# Patient Record
Sex: Male | Born: 1980 | Race: White | Hispanic: No | Marital: Married | Smoking: Current every day smoker
Health system: Southern US, Community
[De-identification: ages and names within clinical notes are randomized; demographics above are authoritative.]

## PROBLEM LIST (undated history)

## (undated) DIAGNOSIS — F1120 Opioid dependence, uncomplicated: Secondary | ICD-10-CM

## (undated) DIAGNOSIS — G5600 Carpal tunnel syndrome, unspecified upper limb: Secondary | ICD-10-CM

## (undated) HISTORY — DX: Carpal tunnel syndrome, unspecified upper limb: G56.00

## (undated) HISTORY — DX: Opioid dependence, uncomplicated: F11.20

## (undated) HISTORY — PX: FOOT SURGERY: SHX648

---
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
## 2002-05-08 ENCOUNTER — Emergency Department (HOSPITAL_COMMUNITY): Admission: EM | Admit: 2002-05-08 | Discharge: 2002-05-08 | Payer: Self-pay | Admitting: Emergency Medicine

## 2002-05-08 ENCOUNTER — Encounter: Payer: Self-pay | Admitting: Emergency Medicine

## 2002-06-10 ENCOUNTER — Encounter: Payer: Self-pay | Admitting: Emergency Medicine

## 2002-06-10 ENCOUNTER — Emergency Department (HOSPITAL_COMMUNITY): Admission: EM | Admit: 2002-06-10 | Discharge: 2002-06-10 | Payer: Self-pay | Admitting: Emergency Medicine

## 2002-06-21 ENCOUNTER — Encounter: Payer: Self-pay | Admitting: Emergency Medicine

## 2002-06-21 ENCOUNTER — Emergency Department (HOSPITAL_COMMUNITY): Admission: EM | Admit: 2002-06-21 | Discharge: 2002-06-21 | Payer: Self-pay | Admitting: Emergency Medicine

## 2002-06-28 ENCOUNTER — Emergency Department (HOSPITAL_COMMUNITY): Admission: EM | Admit: 2002-06-28 | Discharge: 2002-06-28 | Payer: Self-pay | Admitting: Emergency Medicine

## 2002-08-29 ENCOUNTER — Emergency Department (HOSPITAL_COMMUNITY): Admission: EM | Admit: 2002-08-29 | Discharge: 2002-08-29 | Payer: Self-pay

## 2002-09-10 ENCOUNTER — Emergency Department (HOSPITAL_COMMUNITY): Admission: EM | Admit: 2002-09-10 | Discharge: 2002-09-10 | Payer: Self-pay | Admitting: Emergency Medicine

## 2002-09-10 ENCOUNTER — Encounter: Payer: Self-pay | Admitting: Emergency Medicine

## 2003-05-06 ENCOUNTER — Emergency Department (HOSPITAL_COMMUNITY): Admission: EM | Admit: 2003-05-06 | Discharge: 2003-05-06 | Payer: Self-pay | Admitting: Emergency Medicine

## 2003-05-15 ENCOUNTER — Emergency Department (HOSPITAL_COMMUNITY): Admission: EM | Admit: 2003-05-15 | Discharge: 2003-05-15 | Payer: Self-pay | Admitting: Emergency Medicine

## 2003-05-30 ENCOUNTER — Emergency Department (HOSPITAL_COMMUNITY): Admission: EM | Admit: 2003-05-30 | Discharge: 2003-05-30 | Payer: Self-pay | Admitting: Emergency Medicine

## 2003-06-18 ENCOUNTER — Emergency Department (HOSPITAL_COMMUNITY): Admission: EM | Admit: 2003-06-18 | Discharge: 2003-06-18 | Payer: Self-pay | Admitting: Emergency Medicine

## 2003-06-18 ENCOUNTER — Encounter: Payer: Self-pay | Admitting: Emergency Medicine

## 2003-07-26 ENCOUNTER — Emergency Department (HOSPITAL_COMMUNITY): Admission: AD | Admit: 2003-07-26 | Discharge: 2003-07-26 | Payer: Self-pay | Admitting: Family Medicine

## 2003-08-01 ENCOUNTER — Emergency Department (HOSPITAL_COMMUNITY): Admission: EM | Admit: 2003-08-01 | Discharge: 2003-08-02 | Payer: Self-pay | Admitting: *Deleted

## 2003-12-07 ENCOUNTER — Emergency Department (HOSPITAL_COMMUNITY): Admission: EM | Admit: 2003-12-07 | Discharge: 2003-12-07 | Payer: Self-pay | Admitting: Emergency Medicine

## 2003-12-11 ENCOUNTER — Emergency Department (HOSPITAL_COMMUNITY): Admission: AD | Admit: 2003-12-11 | Discharge: 2003-12-12 | Payer: Self-pay | Admitting: Emergency Medicine

## 2004-02-06 ENCOUNTER — Emergency Department (HOSPITAL_COMMUNITY): Admission: EM | Admit: 2004-02-06 | Discharge: 2004-02-06 | Payer: Self-pay | Admitting: Emergency Medicine

## 2004-05-09 ENCOUNTER — Emergency Department (HOSPITAL_COMMUNITY): Admission: EM | Admit: 2004-05-09 | Discharge: 2004-05-09 | Payer: Self-pay | Admitting: Emergency Medicine

## 2004-05-11 ENCOUNTER — Emergency Department (HOSPITAL_COMMUNITY): Admission: EM | Admit: 2004-05-11 | Discharge: 2004-05-11 | Payer: Self-pay | Admitting: Family Medicine

## 2004-07-09 ENCOUNTER — Emergency Department (HOSPITAL_COMMUNITY): Admission: EM | Admit: 2004-07-09 | Discharge: 2004-07-09 | Payer: Self-pay | Admitting: Emergency Medicine

## 2004-07-14 ENCOUNTER — Emergency Department (HOSPITAL_COMMUNITY): Admission: EM | Admit: 2004-07-14 | Discharge: 2004-07-14 | Payer: Self-pay | Admitting: Emergency Medicine

## 2004-07-18 ENCOUNTER — Emergency Department (HOSPITAL_COMMUNITY): Admission: EM | Admit: 2004-07-18 | Discharge: 2004-07-18 | Payer: Self-pay | Admitting: Emergency Medicine

## 2004-08-06 ENCOUNTER — Emergency Department (HOSPITAL_COMMUNITY): Admission: EM | Admit: 2004-08-06 | Discharge: 2004-08-07 | Payer: Self-pay | Admitting: Emergency Medicine

## 2004-09-08 ENCOUNTER — Encounter
Admission: RE | Admit: 2004-09-08 | Discharge: 2004-12-07 | Payer: Self-pay | Admitting: Physical Medicine & Rehabilitation

## 2004-09-10 ENCOUNTER — Ambulatory Visit: Payer: Self-pay | Admitting: Physical Medicine & Rehabilitation

## 2004-10-02 ENCOUNTER — Ambulatory Visit: Payer: Self-pay | Admitting: Psychology

## 2004-11-02 ENCOUNTER — Emergency Department (HOSPITAL_COMMUNITY): Admission: EM | Admit: 2004-11-02 | Discharge: 2004-11-02 | Payer: Self-pay | Admitting: Emergency Medicine

## 2005-06-18 ENCOUNTER — Encounter: Payer: Self-pay | Admitting: Anesthesiology

## 2005-06-27 ENCOUNTER — Encounter: Payer: Self-pay | Admitting: Anesthesiology

## 2006-01-11 ENCOUNTER — Emergency Department (HOSPITAL_COMMUNITY): Admission: EM | Admit: 2006-01-11 | Discharge: 2006-01-11 | Payer: Self-pay | Admitting: Emergency Medicine

## 2006-01-23 ENCOUNTER — Emergency Department (HOSPITAL_COMMUNITY): Admission: EM | Admit: 2006-01-23 | Discharge: 2006-01-23 | Payer: Self-pay | Admitting: Emergency Medicine

## 2006-02-16 ENCOUNTER — Emergency Department (HOSPITAL_COMMUNITY): Admission: EM | Admit: 2006-02-16 | Discharge: 2006-02-16 | Payer: Self-pay | Admitting: Emergency Medicine

## 2006-04-25 ENCOUNTER — Emergency Department (HOSPITAL_COMMUNITY): Admission: EM | Admit: 2006-04-25 | Discharge: 2006-04-26 | Payer: Self-pay | Admitting: Emergency Medicine

## 2006-05-16 ENCOUNTER — Emergency Department (HOSPITAL_COMMUNITY): Admission: EM | Admit: 2006-05-16 | Discharge: 2006-05-16 | Payer: Self-pay | Admitting: Emergency Medicine

## 2006-06-10 ENCOUNTER — Emergency Department (HOSPITAL_COMMUNITY): Admission: EM | Admit: 2006-06-10 | Discharge: 2006-06-10 | Payer: Self-pay | Admitting: *Deleted

## 2006-06-26 ENCOUNTER — Emergency Department (HOSPITAL_COMMUNITY): Admission: EM | Admit: 2006-06-26 | Discharge: 2006-06-26 | Payer: Self-pay | Admitting: Emergency Medicine

## 2006-07-03 ENCOUNTER — Emergency Department (HOSPITAL_COMMUNITY): Admission: EM | Admit: 2006-07-03 | Discharge: 2006-07-03 | Payer: Self-pay | Admitting: Emergency Medicine

## 2006-07-18 ENCOUNTER — Emergency Department (HOSPITAL_COMMUNITY): Admission: EM | Admit: 2006-07-18 | Discharge: 2006-07-18 | Payer: Self-pay | Admitting: Emergency Medicine

## 2006-07-19 ENCOUNTER — Emergency Department (HOSPITAL_COMMUNITY): Admission: EM | Admit: 2006-07-19 | Discharge: 2006-07-19 | Payer: Self-pay | Admitting: Emergency Medicine

## 2006-07-24 ENCOUNTER — Emergency Department (HOSPITAL_COMMUNITY): Admission: EM | Admit: 2006-07-24 | Discharge: 2006-07-25 | Payer: Self-pay | Admitting: Emergency Medicine

## 2006-08-04 ENCOUNTER — Encounter: Admission: RE | Admit: 2006-08-04 | Discharge: 2006-08-04 | Payer: Self-pay | Admitting: General Practice
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
## 2007-10-06 ENCOUNTER — Emergency Department (HOSPITAL_COMMUNITY): Admission: EM | Admit: 2007-10-06 | Discharge: 2007-10-06 | Payer: Self-pay | Admitting: Family Medicine

## 2007-11-14 ENCOUNTER — Emergency Department (HOSPITAL_COMMUNITY): Admission: EM | Admit: 2007-11-14 | Discharge: 2007-11-14 | Payer: Self-pay | Admitting: Emergency Medicine

## 2008-06-15 ENCOUNTER — Emergency Department (HOSPITAL_COMMUNITY): Admission: EM | Admit: 2008-06-15 | Discharge: 2008-06-15 | Payer: Self-pay | Admitting: Emergency Medicine

## 2008-06-29 ENCOUNTER — Emergency Department (HOSPITAL_COMMUNITY): Admission: EM | Admit: 2008-06-29 | Discharge: 2008-06-29 | Payer: Self-pay | Admitting: Emergency Medicine

## 2008-06-30 ENCOUNTER — Emergency Department (HOSPITAL_COMMUNITY): Admission: EM | Admit: 2008-06-30 | Discharge: 2008-06-30 | Payer: Self-pay | Admitting: *Deleted

## 2008-07-07 ENCOUNTER — Emergency Department (HOSPITAL_COMMUNITY): Admission: EM | Admit: 2008-07-07 | Discharge: 2008-07-07 | Payer: Self-pay | Admitting: Emergency Medicine

## 2008-08-28 ENCOUNTER — Emergency Department (HOSPITAL_COMMUNITY): Admission: EM | Admit: 2008-08-28 | Discharge: 2008-08-29 | Payer: Self-pay | Admitting: Emergency Medicine

## 2008-09-02 ENCOUNTER — Emergency Department (HOSPITAL_COMMUNITY): Admission: EM | Admit: 2008-09-02 | Discharge: 2008-09-02 | Payer: Self-pay | Admitting: Emergency Medicine

## 2008-09-10 ENCOUNTER — Emergency Department (HOSPITAL_COMMUNITY): Admission: EM | Admit: 2008-09-10 | Discharge: 2008-09-10 | Payer: Self-pay | Admitting: Emergency Medicine

## 2008-09-25 ENCOUNTER — Emergency Department (HOSPITAL_COMMUNITY): Admission: EM | Admit: 2008-09-25 | Discharge: 2008-09-25 | Payer: Self-pay | Admitting: Emergency Medicine

## 2008-10-03 ENCOUNTER — Emergency Department (HOSPITAL_COMMUNITY): Admission: EM | Admit: 2008-10-03 | Discharge: 2008-10-03 | Payer: Self-pay | Admitting: Emergency Medicine

## 2009-02-27 ENCOUNTER — Emergency Department (HOSPITAL_COMMUNITY): Admission: EM | Admit: 2009-02-27 | Discharge: 2009-02-27 | Payer: Self-pay | Admitting: Emergency Medicine
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
## 2009-07-26 ENCOUNTER — Emergency Department (HOSPITAL_COMMUNITY): Admission: EM | Admit: 2009-07-26 | Discharge: 2009-07-26 | Payer: Self-pay | Admitting: Emergency Medicine

## 2009-08-25 ENCOUNTER — Emergency Department (HOSPITAL_COMMUNITY): Admission: EM | Admit: 2009-08-25 | Discharge: 2009-08-25 | Payer: Self-pay | Admitting: Emergency Medicine

## 2009-09-23 ENCOUNTER — Emergency Department (HOSPITAL_COMMUNITY): Admission: EM | Admit: 2009-09-23 | Discharge: 2009-09-24 | Payer: Self-pay | Admitting: Emergency Medicine

## 2009-09-28 ENCOUNTER — Emergency Department (HOSPITAL_COMMUNITY): Admission: EM | Admit: 2009-09-28 | Discharge: 2009-09-28 | Payer: Self-pay | Admitting: Emergency Medicine

## 2009-11-25 ENCOUNTER — Encounter: Admission: RE | Admit: 2009-11-25 | Discharge: 2009-11-25 | Payer: Self-pay | Admitting: Nephrology

## 2010-01-01 ENCOUNTER — Emergency Department: Payer: Self-pay | Admitting: Emergency Medicine

## 2010-02-12 ENCOUNTER — Emergency Department: Payer: Self-pay | Admitting: Emergency Medicine

## 2010-03-18 ENCOUNTER — Inpatient Hospital Stay (HOSPITAL_COMMUNITY): Admission: EM | Admit: 2010-03-18 | Discharge: 2010-03-19 | Payer: Self-pay | Admitting: Emergency Medicine

## 2010-03-18 ENCOUNTER — Ambulatory Visit: Payer: Self-pay | Admitting: Internal Medicine

## 2010-06-02 ENCOUNTER — Emergency Department: Payer: Self-pay | Admitting: Emergency Medicine

## 2010-06-09 ENCOUNTER — Emergency Department: Payer: Self-pay | Admitting: Emergency Medicine

## 2010-08-17 ENCOUNTER — Emergency Department: Payer: Self-pay | Admitting: Emergency Medicine

## 2010-08-29 ENCOUNTER — Emergency Department: Payer: Self-pay | Admitting: Emergency Medicine

## 2010-09-26 ENCOUNTER — Emergency Department: Payer: Self-pay | Admitting: Emergency Medicine

## 2010-10-17 ENCOUNTER — Encounter: Payer: Self-pay | Admitting: Physical Medicine & Rehabilitation

## 2010-10-18 ENCOUNTER — Encounter: Payer: Self-pay | Admitting: Sports Medicine

## 2010-10-18 ENCOUNTER — Encounter: Payer: Self-pay | Admitting: Nephrology

## 2010-12-13 LAB — CARDIAC PANEL(CRET KIN+CKTOT+MB+TROPI)
Relative Index: INVALID (ref 0.0–2.5)
Relative Index: INVALID (ref 0.0–2.5)
Total CK: 59 U/L (ref 7–232)
Total CK: 71 U/L (ref 7–232)

## 2010-12-13 LAB — URINALYSIS, ROUTINE W REFLEX MICROSCOPIC
Glucose, UA: NEGATIVE mg/dL
Hgb urine dipstick: NEGATIVE
Specific Gravity, Urine: 1.013 (ref 1.005–1.030)
Urobilinogen, UA: 0.2 mg/dL (ref 0.0–1.0)
pH: 6.5 (ref 5.0–8.0)

## 2010-12-13 LAB — BASIC METABOLIC PANEL
CO2: 24 mEq/L (ref 19–32)
CO2: 27 mEq/L (ref 19–32)
Calcium: 8.2 mg/dL — ABNORMAL LOW (ref 8.4–10.5)
Calcium: 8.4 mg/dL (ref 8.4–10.5)
Calcium: 8.8 mg/dL (ref 8.4–10.5)
Chloride: 110 mEq/L (ref 96–112)
Creatinine, Ser: 0.93 mg/dL (ref 0.4–1.5)
GFR calc Af Amer: >60 mL/min (ref >60)
GFR calc Af Amer: >60 mL/min (ref >60)
GFR calc Af Amer: >60 mL/min (ref >60)
GFR calc non Af Amer: >60 mL/min (ref >60)
GFR calc non Af Amer: >60 mL/min (ref >60)
Glucose, Bld: 96 mg/dL (ref 70–99)
Potassium: 3.4 mEq/L — ABNORMAL LOW (ref 3.5–5.1)
Sodium: 140 mEq/L (ref 135–145)
Sodium: 142 mEq/L (ref 135–145)

## 2010-12-13 LAB — CBC
Hemoglobin: 12.6 g/dL — ABNORMAL LOW (ref 13.0–17.0)
Hemoglobin: 15.6 g/dL (ref 13.0–17.0)
MCH: 30.8 pg (ref 26.0–34.0)
MCV: 89.6 fL (ref 78.0–100.0)
Platelets: 139 10*3/uL — ABNORMAL LOW (ref 150–400)
Platelets: 170 10*3/uL (ref 150–400)
RBC: 4.08 MIL/uL — ABNORMAL LOW (ref 4.22–5.81)
RDW: 13.4 % (ref 11.5–15.5)
WBC: 6.7 10*3/uL (ref 4.0–10.5)
WBC: 9 10*3/uL (ref 4.0–10.5)

## 2010-12-13 LAB — RAPID URINE DRUG SCREEN, HOSP PERFORMED
Amphetamines: NOT DETECTED
Barbiturates: NOT DETECTED
Benzodiazepines: POSITIVE — AB
Opiates: NOT DETECTED

## 2010-12-13 LAB — ACETAMINOPHEN LEVEL: Acetaminophen (Tylenol), Serum: <10 ug/mL — ABNORMAL LOW (ref 10–30)

## 2010-12-13 LAB — URINE CULTURE: Culture: NO GROWTH

## 2010-12-13 LAB — POCT I-STAT, CHEM 8
Calcium, Ion: 1.22 mmol/L (ref 1.12–1.32)
Creatinine, Ser: 1 mg/dL (ref 0.4–1.5)
Glucose, Bld: 130 mg/dL — ABNORMAL HIGH (ref 70–99)
Hemoglobin: 16 g/dL (ref 13.0–17.0)
TCO2: 28 mmol/L (ref 0–100)

## 2010-12-13 LAB — MRSA PCR SCREENING: MRSA by PCR: NEGATIVE

## 2010-12-13 LAB — LEGIONELLA ANTIGEN, URINE: Legionella Antigen, Urine: NEGATIVE

## 2010-12-13 LAB — POCT I-STAT 3, ART BLOOD GAS (G3+)
Bicarbonate: 23.9 mEq/L (ref 20.0–24.0)
O2 Saturation: 100 %
TCO2: 25 mmol/L (ref 0–100)
pH, Arterial: 7.422 (ref 7.350–7.450)
pO2, Arterial: 461 mmHg — ABNORMAL HIGH (ref 80.0–100.0)
pO2, Arterial: 93 mmHg (ref 80.0–100.0)

## 2010-12-13 LAB — CK TOTAL AND CKMB (NOT AT ARMC)
CK, MB: 0.9 ng/mL (ref 0.3–4.0)
Relative Index: 0.9 (ref 0.0–2.5)

## 2010-12-13 LAB — DIFFERENTIAL
Basophils Absolute: 0 10*3/uL (ref 0.0–0.1)
Eosinophils Absolute: 0 10*3/uL (ref 0.0–0.7)
Eosinophils Relative: 0 % (ref 0–5)
Lymphocytes Relative: 14 % (ref 12–46)
Neutrophils Relative %: 78 % — ABNORMAL HIGH (ref 43–77)

## 2010-12-13 LAB — MAGNESIUM: Magnesium: 2.1 mg/dL (ref 1.5–2.5)

## 2010-12-13 LAB — LACTIC ACID, PLASMA: Lactic Acid, Venous: 1.1 mmol/L (ref 0.5–2.2)

## 2010-12-13 LAB — GLUCOSE, CAPILLARY: Glucose-Capillary: 97 mg/dL (ref 70–99)

## 2010-12-13 LAB — PHOSPHORUS: Phosphorus: 4.7 mg/dL — ABNORMAL HIGH (ref 2.3–4.6)

## 2010-12-13 LAB — CULTURE, BLOOD (ROUTINE X 2)

## 2010-12-13 LAB — PROTIME-INR: Prothrombin Time: 13.8 seconds (ref 11.6–15.2)

## 2010-12-13 LAB — LACTATE DEHYDROGENASE: LDH: 152 U/L (ref 94–250)

## 2010-12-13 LAB — ETHANOL: Alcohol, Ethyl (B): <5 mg/dL (ref 0–10)

## 2010-12-28 LAB — URINALYSIS, ROUTINE W REFLEX MICROSCOPIC
Glucose, UA: NEGATIVE mg/dL
Hgb urine dipstick: NEGATIVE
pH: 6.5 (ref 5.0–8.0)
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
## 2012-10-01 ENCOUNTER — Emergency Department (HOSPITAL_COMMUNITY): Payer: Medicaid Other

## 2012-10-01 ENCOUNTER — Encounter (HOSPITAL_COMMUNITY): Payer: Self-pay

## 2012-10-01 ENCOUNTER — Emergency Department (HOSPITAL_COMMUNITY)
Admission: EM | Admit: 2012-10-01 | Discharge: 2012-10-01 | Disposition: A | Discharge status: Home / Self Care | Payer: Medicaid Other | Attending: Emergency Medicine | Admitting: Emergency Medicine

## 2012-10-01 DIAGNOSIS — J40 Bronchitis, not specified as acute or chronic: Secondary | ICD-10-CM

## 2012-10-01 DIAGNOSIS — J3489 Other specified disorders of nose and nasal sinuses: Secondary | ICD-10-CM | POA: Insufficient documentation

## 2012-10-01 DIAGNOSIS — R61 Generalized hyperhidrosis: Secondary | ICD-10-CM | POA: Insufficient documentation

## 2012-10-01 DIAGNOSIS — F17200 Nicotine dependence, unspecified, uncomplicated: Secondary | ICD-10-CM | POA: Insufficient documentation

## 2012-10-01 DIAGNOSIS — R05 Cough: Secondary | ICD-10-CM | POA: Insufficient documentation

## 2012-10-01 DIAGNOSIS — R509 Fever, unspecified: Secondary | ICD-10-CM | POA: Insufficient documentation

## 2012-10-01 DIAGNOSIS — R059 Cough, unspecified: Secondary | ICD-10-CM | POA: Insufficient documentation

## 2012-10-01 LAB — RAPID STREP SCREEN (MED CTR MEBANE ONLY): Streptococcus, Group A Screen (Direct): NEGATIVE

## 2012-10-01 MED ORDER — PREDNISONE 20 MG PO TABS
60.0000 mg | ORAL_TABLET | Freq: Once | ORAL | Status: AC
  Administered 2012-10-01: 60 mg via ORAL
  Filled 2012-10-01: qty 3

## 2012-10-01 MED ORDER — ALBUTEROL SULFATE HFA 108 (90 BASE) MCG/ACT IN AERS
2.0000 | INHALATION_SPRAY | Freq: Once | RESPIRATORY_TRACT | Status: AC
  Administered 2012-10-01: 2 via RESPIRATORY_TRACT
  Filled 2012-10-01: qty 6.7

## 2012-10-01 MED ORDER — AZITHROMYCIN 250 MG PO TABS: 250.0000 mg | ORAL_TABLET | Freq: Every day | ORAL | Status: DC

## 2012-10-01 MED ORDER — BENZONATATE 100 MG PO CAPS: 100.0000 mg | ORAL_CAPSULE | Freq: Three times a day (TID) | ORAL | Status: DC

## 2012-10-01 MED ORDER — PREDNISONE 10 MG PO TABS: ORAL_TABLET | ORAL | Status: DC

## 2012-10-01 NOTE — ED Notes (Signed)
Patient reports productive cough with yellow/brown sputum, fever, and sore throat x 3 days.

## 2012-10-01 NOTE — ED Provider Notes (Signed)
History  This chart was scribed for Lottie Mussel, PA by Erskine Emery, ED Scribe. This patient was seen in room WTR6/WTR6 and the patient's care was started at 20:19.    CSN: [id number]  Arrival date & time 10/01/12  1751   First MD Initiated Contact with Patient 10/01/12 2019      Chief Complaint  Patient presents with  . Sore Throat  . Cough  . Fever    (Consider location/radiation/quality/duration/timing/severity/associated sxs/prior treatment) The history is provided by the patient and the spouse. No language interpreter was used.  Lucas Wong is a 32 y.o. male who presents to the Emergency Department complaining of sore throat, cough, fever (around 102 degrees), chills, diaphoresis, and sinus congestion for the past 4 days. Pt denies any associated leg swelling or emesis. Pt has been taking Nyquil which helps him sleep but has not relived his symptoms. Pt is a smoker.  History reviewed. No pertinent past medical history.  Past Surgical History  Procedure Date  . Foot surgery     History reviewed. No pertinent family history.  History  Substance Use Topics  . Smoking status: Current Every Day Smoker -- 1.0 packs/day    Types: Cigarettes  . Smokeless tobacco: Never Used  . Alcohol Use: No      Review of Systems  Constitutional: Positive for fever, chills and diaphoresis.  HENT: Positive for congestion and sinus pressure.   Respiratory: Positive for cough. Negative for shortness of breath.   Gastrointestinal: Negative for nausea and vomiting.  Musculoskeletal: Negative for joint swelling.  Neurological: Negative for weakness.    Allergies  Amoxicillin and Penicillins  Home Medications   Current Outpatient Rx  Name  Route  Sig  Dispense  Refill  . ADULT MULTIVITAMIN W/MINERALS CH   Oral   Take 1 tablet by mouth daily.         Marland Kitchen PSEUDOEPH-DOXYLAMINE-DM-APAP 60-7.02-23-999 MG/30ML PO LIQD   Oral   Take 30 mLs by mouth as needed. Cold symp          Triage Vitals: BP 116/73  Pulse 88  Temp 98.7 F (37.1 C) (Oral)  Resp 18  SpO2 92%  Physical Exam  Nursing note and vitals reviewed. Constitutional: He is oriented to person, place, and time. He appears well-developed and well-nourished. No distress.  HENT:  Head: Normocephalic and atraumatic.  Right Ear: External ear normal.  Left Ear: External ear normal.  Mouth/Throat: No oropharyngeal exudate.       Throat is erythematous. No swelling of the tonsils. No exudate.  Eyes: EOM are normal. Pupils are equal, round, and reactive to light.  Neck: Neck supple. No tracheal deviation present.  Cardiovascular: Normal rate, regular rhythm and normal heart sounds.   Pulmonary/Chest: Effort normal. No respiratory distress. He has wheezes.       Inspiratory and expiratory wheezes in all lung fields.  Abdominal: Soft. He exhibits no distension.  Musculoskeletal: Normal range of motion. He exhibits no edema.  Neurological: He is alert and oriented to person, place, and time.  Skin: Skin is warm and dry.  Psychiatric: He has a normal mood and affect.    ED Course  Procedures (including critical care time) DIAGNOSTIC STUDIES: Oxygen Saturation is 92% on room air, adequate by my interpretation.    COORDINATION OF CARE: 20:43--I evaluated the patient and we discussed a treatment plan including chest x-ray and strep test to which the pt agreed. I instructed the pt to quit smoking. I notified  him that his strep test is negative.  21:07--I rechecked the pt who requested to leave without getting his chest x-ray and discussed with him the risks.   Results for orders placed during the hospital encounter of 10/01/12  RAPID STREP SCREEN      Component Value Range   Streptococcus, Group A Screen (Direct) NEGATIVE  NEGATIVE   Dg Chest 2 View  10/01/2012  *RADIOLOGY REPORT*  Clinical Data: Cough and fever.  Sore throat.  CHEST - 2 VIEW  Comparison: CT chest 03/18/2010 and plain film  chest 03/19/2010.  Findings: The the lungs are clear.  There is some peribronchial thickening.  Heart size is normal.  No pneumothorax or pleural effusion.  IMPRESSION: Bronchitic change without focal process.   Original Report Authenticated By: Holley Dexter, M.D.      MDM  Pt with URI symptoms for 3 days. On exam he is non toxic appearing, oxygen sat at low end of normal, at 92% on RA, i rechecked it, it was 95. He is a smoker. Audible expiratory and inspiratory wheezes bilaterally. CXR obtaiend to r/o pneumonia and is negative. Will treat with steroid course, zpack, albuterol inhaler. Pt discharged in stable condition.   Filed Vitals:   10/01/12 2156  BP: 107/61  Pulse: 74  Temp: 98.5 F (36.9 C)  Resp: 18        I personally performed the services described in this documentation, which was scribed in my presence. The recorded information has been reviewed and is accurate.    Lottie Mussel, PA 10/02/12 [phone]

## 2012-10-03 NOTE — ED Provider Notes (Signed)
Medical screening examination/treatment/procedure(s) were performed by non-physician practitioner and as supervising physician I was immediately available for consultation/collaboration.  Doug Sou, MD 10/03/12 [phone]

## 2014-03-22 ENCOUNTER — Ambulatory Visit (INDEPENDENT_AMBULATORY_CARE_PROVIDER_SITE_OTHER): Payer: Medicaid Other | Admitting: Podiatrist

## 2014-03-22 ENCOUNTER — Ambulatory Visit (INDEPENDENT_AMBULATORY_CARE_PROVIDER_SITE_OTHER): Payer: Medicaid Other

## 2014-03-22 ENCOUNTER — Encounter: Payer: Self-pay | Admitting: Podiatrist

## 2014-03-22 VITALS — BP 110/73 | HR 61 | Resp 14 | Ht 74.0 in | Wt 160.0 lb

## 2014-03-22 DIAGNOSIS — M2040 Other hammer toe(s) (acquired), unspecified foot: Secondary | ICD-10-CM

## 2014-03-22 DIAGNOSIS — M2010 Hallux valgus (acquired), unspecified foot: Secondary | ICD-10-CM

## 2014-03-22 NOTE — Progress Notes (Deleted)
   Subjective:    Patient ID: Lucas Wong, male    DOB: [birth year], 33 y.o.   MRN: [id number]  HPI Comments: Pt complains of pain in right 2nd, and left 3rd toes, that contract down.     Review of Systems     Objective:   Physical Exam        Assessment & Plan:  Akin, ht distal 2 bilater, full ht repair 3 right

## 2014-03-22 NOTE — Patient Instructions (Signed)
Pre-Operative Instructions  Congratulations, you have decided to take an important step to improving your quality of life.  You can be assured that the doctors of [facility] will be with you every step of the way.  1. Plan to be at the surgery center/hospital at least 1 (one) hour prior to your scheduled time unless otherwise directed by the surgical center/hospital staff.  You must have a responsible adult accompany you, remain during the surgery and drive you home.  Make sure you have directions to the surgical center/hospital and know how to get there on time. 2. For hospital based surgery you will need to obtain a history and physical form from your family physician within 1 month prior to the date of surgery- we will give you a form for you primary physician.  3. We make every effort to accommodate the date you request for surgery.  There are however, times where surgery dates or times have to be moved.  We will contact you as soon as possible if a change in schedule is required.   4. No Aspirin/Ibuprofen for one week before surgery.  If you are on aspirin, any non-steroidal anti-inflammatory medications (Mobic, Aleve, Ibuprofen) you should stop taking it 7 days prior to your surgery.  You make take Tylenol  For pain prior to surgery.  5. Medications- If you are taking daily heart and blood pressure medications, seizure, reflux, allergy, asthma, anxiety, pain or diabetes medications, make sure the surgery center/hospital is aware before the day of surgery so they may notify you which medications to take or avoid the day of surgery. 6. No food or drink after midnight the night before surgery unless directed otherwise by surgical center/hospital staff. 7. No alcoholic beverages 24 hours prior to surgery.  No smoking 24 hours prior to or 24 hours after surgery. 8. Wear loose pants or shorts- loose enough to fit over bandages, boots, and casts. 9. No slip on shoes, sneakers are best. 10. Bring  your boot with you to the surgery center/hospital.  Also bring crutches or a walker if your physician has prescribed it for you.  If you do not have this equipment, it will be provided for you after surgery. 11. If you have not been contracted by the surgery center/hospital by the day before your surgery, call to confirm the date and time of your surgery. 12. Leave-time from work may vary depending on the type of surgery you have.  Appropriate arrangements should be made prior to surgery with your employer. 13. Prescriptions will be provided immediately following surgery by your doctor.  Have these filled as soon as possible after surgery and take the medication as directed. 14. Remove nail polish on the operative foot. 15. Wash the night before surgery.  The night before surgery wash the foot and leg well with the antibacterial soap provided and water paying special attention to beneath the toenails and in between the toes.  Rinse thoroughly with water and dry well with a towel.  Perform this wash unless told not to do so by your physician.  Enclosed: 1 Ice pack (please put in freezer the night before surgery)   1 Hibiclens skin cleaner   Pre-op Instructions  If you have any questions regarding the instructions, do not hesitate to call our office.  [place]: [address] [place], [place] [address] [phone]  [place]: [address] [place], [place] [address] [phone]  [place]: [address]  [place], [place] [address] [phone]  Dr. Elman   Tuchman DPM, Dr. Norman Regal DPM Dr. Rolan Sikora DPM, Dr. M. Todd Hyatt DPM, Dr. Kathryn Egerton DPM 

## 2014-03-25 NOTE — Progress Notes (Signed)
Chief Complaint  Patient presents with  . Hammer Toe    "This toe is starting to hurt where it hits the ground." left 3rd, and right 2nd toes     HPI: Patient is 33 y.o. male who presents today for painful toes digits 2 bilateral and the left 3rd.  Patient states the toes curl under and when his foot hits the ground, the toes are painful.  They are painful in several different types of shoes and continue to get worse.     Review of Systems  DATA OBTAINED: from patient, and is noted to be negative.  GENERAL: Feels well no fevers, no fatigue, no changes in appetite SKIN: No itching, no rashes, no open lesions, no wounds EYES: No eye pain,no redness, no discharge EARS: No earache,no ringing of ears, no recent change in hearing NOSE: No congestion, no drainage, no bleeding  MOUTH/THROAT: No mouth pain, No sore throat, No difficulty chewing or swallowing  RESPIRATORY: No cough, no wheezing, no SOB CARDIAC: No chest pain,no heart palpitations,no new onset lower extremity edema  GI: No abdominal pain, No Nausea, no vomiting, no diarrhea, no heartburn or no reflux  GU: No dysuria, no increased frequency or urgency MUSCULOSKELETAL: No unrelieved bone/joint pain,  NEUROLOGIC: Awake, alert, appropriate to situation, No change in mental status. PSYCHIATRIC: No overt anxiety or sadness.No behavior issue.  AMBULATION:  Ambulates unassisted    Physical Exam  GENERAL APPEARANCE: Alert, conversant.  No acute distre VASCULAR: Pedal pulses palpable and strong bilateral.  Capillary refill time is immediate to all digits,  Proximal to distal cooling it warm to warm.  Digital hair growth is present bilateral  NEUROLOGIC: sensation is intact epicritically and protectively to 5.07 monofilament at 5/5 sites bilateral.  Light touch is intact bilateral, vibratory sensation intact bilateral, achilles tendon reflex is intact bilateral. MUSCULOSKELETAL: acceptable muscle strength, tone and stability bilateral.   Left Hallux is laterally deviated causing pressure on the 2nd toe.  Contracture deformity at the distal interphalangeal joint of bilateral feet is noted.  Contracture at the proximal interphalangeal joint and distal interphalangeal joint of the 3rd toe is also present right foot.    DERMATOLOGIC: skin color, texture, and turger are within normal limits.  No preulcerative lesions are seen, no interdigital maceration noted.   Assessment   Digital deformity digits 2 bilateral, 3 right.  Hallus valgus left.  Tendon contracture    Plan:  Conservative shoe gear changes and splinting have been tried and the patent is still experiencing pain in the feet.  We discussed surgery to correct the digital deformities and straigten the toes.  The consent form was discussed and all three pages were signed and the patient's questions were encouraged and answered to the best of my ability. Risks of the surgery were discussed including but not limited to continued pain, infection, swelling, elevated toe, decreased range of motion,  Or suture or implant reaction. Preoperative instructions were also dispensed to the patient as well as a preoperative surgical pamphlet to go along with the instructions. Surgery will be scheduled at the patients convenience and [facility]. Patient  is instructed to call if any questions or concerns arise prior to the surgical procedure date.  We also discussed pain medication  Patient relates his primary care physician is OK with pain medication after the procedure to help with the surgical pain.  I will likely prescribe oxycodone/ apap and will typically keep the  patient on the medication for only up to 2 weeks post operatively.

## 2014-10-01 ENCOUNTER — Encounter (HOSPITAL_COMMUNITY): Payer: Self-pay

## 2014-10-01 ENCOUNTER — Emergency Department (HOSPITAL_COMMUNITY): Payer: Medicaid Other

## 2014-10-01 ENCOUNTER — Emergency Department (HOSPITAL_COMMUNITY)
Admission: EM | Admit: 2014-10-01 | Discharge: 2014-10-01 | Disposition: A | Discharge status: Home / Self Care | Payer: Medicaid Other | Attending: Emergency Medicine | Admitting: Emergency Medicine

## 2014-10-01 DIAGNOSIS — Z79899 Other long term (current) drug therapy: Secondary | ICD-10-CM | POA: Insufficient documentation

## 2014-10-01 DIAGNOSIS — Z880 Allergy status to penicillin: Secondary | ICD-10-CM | POA: Insufficient documentation

## 2014-10-01 DIAGNOSIS — Z720 Tobacco use: Secondary | ICD-10-CM | POA: Insufficient documentation

## 2014-10-01 DIAGNOSIS — M25532 Pain in left wrist: Secondary | ICD-10-CM | POA: Insufficient documentation

## 2014-10-01 DIAGNOSIS — R52 Pain, unspecified: Secondary | ICD-10-CM

## 2014-10-01 MED ORDER — IBUPROFEN 200 MG PO TABS
600.0000 mg | ORAL_TABLET | Freq: Once | ORAL | Status: AC
  Administered 2014-10-01: 600 mg via ORAL
  Filled 2014-10-01: qty 3

## 2014-10-01 MED ORDER — NAPROXEN 500 MG PO TABS: 500.0000 mg | ORAL_TABLET | Freq: Two times a day (BID) | ORAL | Status: DC

## 2014-10-01 NOTE — Discharge Instructions (Signed)
Wrist Pain Wrist injuries are frequent in adults and children. A sprain is an injury to the ligaments that hold your bones together. A strain is an injury to muscle or muscle cord-like structures (tendons) from stretching or pulling. Generally, when wrists are moderately tender to touch following a fall or injury, a break in the bone (fracture) may be present. Most wrist sprains or strains are better in 3 to 5 days, but complete healing may take several weeks. HOME CARE INSTRUCTIONS   Put ice on the injured area.  Put ice in a plastic bag.  Place a towel between your skin and the bag.  Leave the ice on for 15-20 minutes, 3-4 times a day, for the first 2 days, or as directed by your health care provider.  Keep your arm raised above the level of your heart whenever possible to reduce swelling and pain.  Rest the injured area for at least 48 hours or as directed by your health care provider.  If a splint or elastic bandage has been applied, use it for as long as directed by your health care provider or until seen by a health care provider for a follow-up exam.  Only take over-the-counter or prescription medicines for pain, discomfort, or fever as directed by your health care provider.  Keep all follow-up appointments. You may need to follow up with a specialist or have follow-up X-rays. Improvement in pain level is not a guarantee that you did not fracture a bone in your wrist. The only way to determine whether or not you have a broken bone is by X-ray. SEEK IMMEDIATE MEDICAL CARE IF:   Your fingers are swollen, very red, white, or cold and blue.  Your fingers are numb or tingling.  You have increasing pain.  You have difficulty moving your fingers. MAKE SURE YOU:   Understand these instructions.  Will watch your condition.  Will get help right away if you are not doing well or get worse. Document Released: 06/23/2005 Document Revised: 09/18/2013 Document Reviewed:  11/04/2010 [facility] Patient Information 2015 [place], [place]. This information is not intended to replace advice given to you by your health care provider. Make sure you discuss any questions you have with your health care provider. Cast or Splint Care Casts and splints support injured limbs and keep bones from moving while they heal. It is important to care for your cast or splint at home.  HOME CARE INSTRUCTIONS  Keep the cast or splint uncovered during the drying period. It can take 24 to 48 hours to dry if it is made of plaster. A fiberglass cast will dry in less than 1 hour.  Do not rest the cast on anything harder than a pillow for the first 24 hours.  Do not put weight on your injured limb or apply pressure to the cast until your health care provider gives you permission.  Keep the cast or splint dry. Wet casts or splints can lose their shape and may not support the limb as well. A wet cast that has lost its shape can also create harmful pressure on your skin when it dries. Also, wet skin can become infected.  Cover the cast or splint with a plastic bag when bathing or when out in the rain or snow. If the cast is on the trunk of the body, take sponge baths until the cast is removed.  If your cast does become wet, dry it with a towel or a blow dryer on the cool setting  only.  Keep your cast or splint clean. Soiled casts may be wiped with a moistened cloth.  Do not place any hard or soft foreign objects under your cast or splint, such as cotton, toilet paper, lotion, or powder.  Do not try to scratch the skin under the cast with any object. The object could get stuck inside the cast. Also, scratching could lead to an infection. If itching is a problem, use a blow dryer on a cool setting to relieve discomfort.  Do not trim or cut your cast or remove padding from inside of it.  Exercise all joints next to the injury that are not immobilized by the cast or splint. For example, if you  have a long leg cast, exercise the hip joint and toes. If you have an arm cast or splint, exercise the shoulder, elbow, thumb, and fingers.  Elevate your injured arm or leg on 1 or 2 pillows for the first 1 to 3 days to decrease swelling and pain.It is best if you can comfortably elevate your cast so it is higher than your heart. SEEK MEDICAL CARE IF:   Your cast or splint cracks.  Your cast or splint is too tight or too loose.  You have unbearable itching inside the cast.  Your cast becomes wet or develops a soft spot or area.  You have a bad smell coming from inside your cast.  You get an object stuck under your cast.  Your skin around the cast becomes red or raw.  You have new pain or worsening pain after the cast has been applied. SEEK IMMEDIATE MEDICAL CARE IF:   You have fluid leaking through the cast.  You are unable to move your fingers or toes.  You have discolored (blue or white), cool, painful, or very swollen fingers or toes beyond the cast.  You have tingling or numbness around the injured area.  You have severe pain or pressure under the cast.  You have any difficulty with your breathing or have shortness of breath.  You have chest pain. Document Released: 09/10/2000 Document Revised: 07/04/2013 Document Reviewed: 03/22/2013 [facility] Patient Information 2015 [place]. This information is not intended to replace advice given to you by your health care provider. Make sure you discuss any questions you have with your health care provider.

## 2014-10-01 NOTE — ED Provider Notes (Signed)
CSN: [id number]     Arrival date & time 10/01/14  1121 History   First MD Initiated Contact with Patient 10/01/14 1139     Chief Complaint  Patient presents with  . Arm Pain  . Wrist Pain    Lucas Wong is a 34 y.o. male who presents to the emergency department complaining of atraumatic left wrist and distal arm pain for the past week. 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 The patient reports that his fingers, including his thumb, have been going numb at night. He reports he will shake his hand and his numbness will resolve. He denies current numbness.  He reports having some wrist swelling. He rates his pain at 6/10 and worse with movement. He has used pain gel on his wrist without relief. He denies previous injury or surgeries to his wrist. He denies fevers, chills, weakness, redness or bruising.  (Consider location/radiation/quality/duration/timing/severity/associated sxs/prior Treatment) HPI  Past Medical History  Diagnosis Date  . Opiate addiction    Past Surgical History  Procedure Laterality Date  . Foot surgery    . Foot surgery     History reviewed. No pertinent family history. History  Substance Use Topics  . Smoking status: Current Every Day Smoker -- 1.00 packs/day    Types: Cigarettes  . Smokeless tobacco: Never Used  . Alcohol Use: No    Review of Systems  Constitutional: Negative for fever and chills.  Respiratory: Negative for cough and shortness of breath.   Gastrointestinal: Negative for abdominal pain.  Genitourinary: Negative for dysuria.  Musculoskeletal: Positive for joint swelling.       Left wrist pain  Skin: Negative for rash and wound.  Neurological: Positive for numbness. Negative for dizziness, weakness and headaches.  All other systems reviewed and are negative.     Allergies  Amoxicillin and Penicillins  Home Medications   Prior to Admission medications   Medication Sig Start Date End Date Taking? Authorizing  Provider  ALPRAZolam (XANAX PO) Take by mouth.    Historical Provider, MD  Buprenorphine HCl-Naloxone HCl (SUBOXONE SL) Place under the tongue.    Historical Provider, MD  ibuprofen (ADVIL,MOTRIN) 100 MG chewable tablet Chew by mouth every 8 (eight) hours as needed.    Historical Provider, MD  Multiple Vitamin (MULTIVITAMIN WITH MINERALS) TABS Take 1 tablet by mouth daily.    Historical Provider, MD  naproxen (NAPROSYN) 500 MG tablet Take 1 tablet (500 mg total) by mouth 2 (two) times daily with a meal. 10/01/14   Einar Gip Virgen Belland, PA-C   BP 117/69 mmHg  Pulse 57  Temp(Src) 98.1 F (36.7 C) (Oral)  Resp 18  SpO2 97% Physical Exam  Constitutional: He appears well-developed and well-nourished. No distress.  HENT:  Head: Normocephalic and atraumatic.  Nose: Nose normal.  Mouth/Throat: Oropharynx is clear and moist. No oropharyngeal exudate.  Eyes: Pupils are equal, round, and reactive to light. Right eye exhibits no discharge. Left eye exhibits no discharge.  Cardiovascular: Normal rate, regular rhythm, normal heart sounds and intact distal pulses.  Exam reveals no gallop and no friction rub.   No murmur heard. Bilateral radial pulses are intact.  Pulmonary/Chest: Effort normal. No respiratory distress.  Abdominal: Soft. There is no tenderness.  Musculoskeletal: Normal range of motion. He exhibits edema and tenderness.  Patient has normal full range of motion of his left wrist hand. Tenderness to palpation over the lateral dorsal aspect of his left wrist. There is some mild edema noted.  No ecchymosis or erythema noted. No deformity noted.  Neurological: He is alert. Coordination normal.  Skin: Skin is warm and dry. No rash noted. He is not diaphoretic. No erythema. No pallor.  Psychiatric: He has a normal mood and affect. His behavior is normal.  Nursing note and vitals reviewed.   ED Course  Procedures (including critical care time) Labs Review Labs Reviewed - No data to  display  Imaging Review Dg Forearm Left  10/01/2014   CLINICAL DATA:  Left forearm pain, left wrist pain, no known injury, lifts heavy objects at work  EXAM: LEFT FOREARM - 2 VIEW  COMPARISON:  Left wrist 10/03/2008  FINDINGS: There is no evidence of fracture or other focal bone lesions. Soft tissues are unremarkable.  IMPRESSION: Negative.   Electronically Signed   By: Natasha MeadLiviu  Pop M.D.   On: 10/01/2014 12:33   Dg Wrist Complete Left  10/01/2014   CLINICAL DATA:  Generalized LEFT wrist and forearm pain, no known trauma, lifts heavy objects at work  EXAM: LEFT WRIST - COMPLETE 3+ VIEW  COMPARISON:  LEFT hand radiographs 10/03/2008, LEFT wrist radiographs 09/02/2008  FINDINGS: Osseous mineralization normal.  Joint spaces preserved.  No fracture, dislocation, or bone destruction.  IMPRESSION: Normal exam.   Electronically Signed   By: Ulyses SouthwardMark  Boles M.D.   On: 10/01/2014 12:26     EKG Interpretation None      Filed Vitals:   10/01/14 1133 10/01/14 1248  BP: 117/69   Pulse: 65 57  Temp: 98.1 F (36.7 C)   TempSrc: Oral   Resp: 18   SpO2: 100% 97%     MDM   Meds given in ED:  Medications  ibuprofen (ADVIL,MOTRIN) tablet 600 mg (600 mg Oral Given 10/01/14 1155)    Discharge Medication List as of 10/01/2014 12:42 PM    START taking these medications   Details  naproxen (NAPROSYN) 500 MG tablet Take 1 tablet (500 mg total) by mouth 2 (two) times daily with a meal., Starting 10/01/2014, Until Discontinued, Print        Final diagnoses:  Left wrist pain   This is a 34 year old male who presents to emergency department complaining of atraumatic left wrist pain last week. 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 Patient reports he has been evaluated by his primary care doctor for carpal tunnel and is due to have nerve studies this month. Patient is afebrile and nontoxic-appearing. Patient does have some mild swelling to the dorsal aspect of  his left lateral wrist. There is some mild tenderness to palpation. Patient's full range of motion of his wrist and fingers. The patient's x-rays were unremarkable. Patient received ibuprofen in the ED. Velcro wrist brace applied in the ED. Prescription provided for naproxen for pain control. I advised the patient to follow-up with their primary care provider this week. I advised the patient to return to the emergency department with new or worsening symptoms or new concerns. The patient verbalized understanding and agreement with plan.      Lawana ChambersWilliam Duncan Grainne Knights, PA-C 10/01/14 1254  Geoffery Lyonsouglas Delo, MD 10/01/14 [phone]

## 2014-10-01 NOTE — ED Notes (Signed)
Per pt, having left wrist and arm pain x 1 week.  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  Pt states hand goes numb and needs gravity to pull blood and pain out of arm.

## 2014-10-04 ENCOUNTER — Encounter: Payer: Self-pay | Admitting: Neurology

## 2014-10-04 ENCOUNTER — Ambulatory Visit (INDEPENDENT_AMBULATORY_CARE_PROVIDER_SITE_OTHER): Payer: Self-pay | Admitting: Neurology

## 2014-10-04 ENCOUNTER — Ambulatory Visit (INDEPENDENT_AMBULATORY_CARE_PROVIDER_SITE_OTHER): Payer: Medicaid Other | Admitting: Neurology

## 2014-10-04 DIAGNOSIS — G5602 Carpal tunnel syndrome, left upper limb: Secondary | ICD-10-CM

## 2014-10-04 DIAGNOSIS — G5603 Carpal tunnel syndrome, bilateral upper limbs: Secondary | ICD-10-CM

## 2014-10-04 DIAGNOSIS — G5600 Carpal tunnel syndrome, unspecified upper limb: Secondary | ICD-10-CM

## 2014-10-04 DIAGNOSIS — G5601 Carpal tunnel syndrome, right upper limb: Secondary | ICD-10-CM

## 2014-10-04 HISTORY — DX: Carpal tunnel syndrome, unspecified upper limb: G56.00

## 2014-10-04 NOTE — Procedures (Signed)
     HISTORY:  Lucas PieriniRichard Wong is a 34 year old gentleman with a 3 week history of some numbness in the hands that wake him up at night. He has more recently developed some pain in the left forearm with thumb extension. The patient denies any neck discomfort. He is being evaluated for a possible neuropathy or a cervical radiculopathy.  NERVE CONDUCTION STUDIES:  Nerve conduction studies were performed on both upper extremities. The distal motor latencies for the median nerves were prolonged bilaterally, with normal motor amplitudes for these nerves bilaterally. The distal motor latencies and motor amplitudes for the ulnar nerves were normal bilaterally. The F wave latencies for the median nerves were prolonged bilaterally, normal for the ulnar nerves bilaterally. The nerve conduction velocities or the median and ulnar nerves were normal bilaterally. The sensory latencies for the median nerves were prolonged bilaterally, normal for the ulnar nerves bilaterally.  EMG STUDIES:  EMG study was performed on the left upper extremity:  The first dorsal interosseous muscle reveals 2 to 4 K units with full recruitment. No fibrillations or positive waves were noted. The abductor pollicis brevis muscle reveals 2 to 5 K units with slightly decreased recruitment. No fibrillations or positive waves were noted. The extensor indicis proprius muscle reveals 1 to 3 K units with full recruitment. No fibrillations or positive waves were noted. The pronator teres muscle reveals 2 to 3 K units with full recruitment. No fibrillations or positive waves were noted. The biceps muscle reveals 1 to 2 K units with full recruitment. No fibrillations or positive waves were noted. The triceps muscle reveals 2 to 4 K units with full recruitment. No fibrillations or positive waves were noted. The anterior deltoid muscle reveals 2 to 3 K units with full recruitment. No fibrillations or positive waves were noted. The cervical  paraspinal muscles were tested at 2 levels. No abnormalities of insertional activity were seen at either level tested. There was good relaxation.  The patient did not wish to pursue EMG evaluation on the right upper extremity.  IMPRESSION:  Nerve conduction studies done on both upper extremities shows evidence of bilateral mild carpal tunnel syndrome. EMG evaluation of the left upper extremity showed some mild changes in the APB muscle consistent with carpal tunnel syndrome, otherwise no evidence of an overlying cervical radiculopathy was seen. The patient declined to have EMG evaluation on the right upper extremity.  Marlan Palau. Keith Adina Puzzo MD 10/04/2014 1:24 PM  [facility] [address] [place], [place] [id number]  Phone [phone] Fax [phone]

## 2014-10-04 NOTE — Progress Notes (Signed)
Please refer to EMG and NCV procedure note. 

## 2015-05-19 ENCOUNTER — Encounter (HOSPITAL_COMMUNITY): Payer: Self-pay | Admitting: *Deleted

## 2015-05-19 ENCOUNTER — Emergency Department (HOSPITAL_COMMUNITY)
Admission: EM | Admit: 2015-05-19 | Discharge: 2015-05-19 | Disposition: A | Discharge status: Home / Self Care | Payer: Medicaid Other | Attending: Emergency Medicine | Admitting: Emergency Medicine

## 2015-05-19 ENCOUNTER — Emergency Department (HOSPITAL_COMMUNITY): Payer: Medicaid Other

## 2015-05-19 DIAGNOSIS — W228XXA Striking against or struck by other objects, initial encounter: Secondary | ICD-10-CM | POA: Diagnosis not present

## 2015-05-19 DIAGNOSIS — Y9289 Other specified places as the place of occurrence of the external cause: Secondary | ICD-10-CM | POA: Insufficient documentation

## 2015-05-19 DIAGNOSIS — Z720 Tobacco use: Secondary | ICD-10-CM | POA: Diagnosis not present

## 2015-05-19 DIAGNOSIS — Y9389 Activity, other specified: Secondary | ICD-10-CM | POA: Insufficient documentation

## 2015-05-19 DIAGNOSIS — Z880 Allergy status to penicillin: Secondary | ICD-10-CM | POA: Insufficient documentation

## 2015-05-19 DIAGNOSIS — Y998 Other external cause status: Secondary | ICD-10-CM | POA: Insufficient documentation

## 2015-05-19 DIAGNOSIS — Z79899 Other long term (current) drug therapy: Secondary | ICD-10-CM | POA: Diagnosis not present

## 2015-05-19 DIAGNOSIS — S51812A Laceration without foreign body of left forearm, initial encounter: Secondary | ICD-10-CM | POA: Diagnosis present

## 2015-05-19 MED ORDER — IBUPROFEN 800 MG PO TABS
800.0000 mg | ORAL_TABLET | Freq: Once | ORAL | Status: AC
  Administered 2015-05-19: 800 mg via ORAL
  Filled 2015-05-19: qty 1

## 2015-05-19 MED ORDER — HYDROCODONE-ACETAMINOPHEN 5-325 MG PO TABS
2.0000 | ORAL_TABLET | Freq: Once | ORAL | Status: AC
  Administered 2015-05-19: 2 via ORAL

## 2015-05-19 MED ORDER — LIDOCAINE-EPINEPHRINE (PF) 2 %-1:200000 IJ SOLN
20.0000 mL | Freq: Once | INTRAMUSCULAR | Status: AC
  Administered 2015-05-19: 20 mL via INTRADERMAL

## 2015-05-19 MED ORDER — IBUPROFEN 600 MG PO TABS
600.0000 mg | ORAL_TABLET | Freq: Four times a day (QID) | ORAL | Status: AC | PRN
Start: 2015-05-19 — End: ?

## 2015-05-19 MED ORDER — HYDROCODONE-ACETAMINOPHEN 5-325 MG PO TABS
ORAL_TABLET | ORAL | Status: AC
  Filled 2015-05-19: qty 2

## 2015-05-19 MED ORDER — BACITRACIN ZINC 500 UNIT/GM EX OINT: 1.0000 "application " | TOPICAL_OINTMENT | Freq: Two times a day (BID) | CUTANEOUS | Status: DC
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
## 2015-05-19 NOTE — ED Notes (Signed)
PA at bedside.
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
## 2015-05-19 NOTE — Discharge Instructions (Signed)

## 2015-06-18 ENCOUNTER — Encounter (HOSPITAL_COMMUNITY): Payer: Self-pay | Admitting: Emergency Medicine

## 2015-06-18 ENCOUNTER — Emergency Department (HOSPITAL_COMMUNITY)
Admission: EM | Admit: 2015-06-18 | Discharge: 2015-06-19 | Disposition: A | Discharge status: Home / Self Care | Payer: Medicaid Other | Attending: Emergency Medicine | Admitting: Emergency Medicine

## 2015-06-18 DIAGNOSIS — Z720 Tobacco use: Secondary | ICD-10-CM | POA: Insufficient documentation

## 2015-06-18 DIAGNOSIS — Z8669 Personal history of other diseases of the nervous system and sense organs: Secondary | ICD-10-CM | POA: Diagnosis not present

## 2015-06-18 DIAGNOSIS — L0231 Cutaneous abscess of buttock: Secondary | ICD-10-CM | POA: Diagnosis not present

## 2015-06-18 DIAGNOSIS — Z79899 Other long term (current) drug therapy: Secondary | ICD-10-CM | POA: Insufficient documentation

## 2015-06-18 DIAGNOSIS — Z880 Allergy status to penicillin: Secondary | ICD-10-CM | POA: Diagnosis not present

## 2015-06-18 NOTE — ED Provider Notes (Signed)
CSN: [id number]     Arrival date & time 06/18/15  2148 History  This chart was scribed for Gerhard Munch, MD by Freida Busman, ED Scribe. This patient was seen in room D35C/D35C and the patient's care was started 11:50 PM.    Chief Complaint  Patient presents with  . Abscess    The history is provided by the patient. No language interpreter was used.     HPI Comments:  Lucas Wong is a 34 y.o. male  who presents to the Emergency Department complaining of fist sized bump between his buttocks, which he noticed 2 days ago with 8/10 constant pain to the site.  He reports associated chills. Pt denies fever. He reports a history of abscess; states he currently has one to his LUE. No alleviating factors noted.   Past Medical History  Diagnosis Date  . Opiate addiction   . Carpal tunnel syndrome 10/04/2014    bilateral   Past Surgical History  Procedure Laterality Date  . Foot surgery    . Foot surgery     No family history on file. Social History  Substance Use Topics  . Smoking status: Current Every Day Smoker -- 0.00 packs/day    Types: Cigarettes  . Smokeless tobacco: Never Used  . Alcohol Use: No    Review of Systems  Constitutional:       Per HPI, otherwise negative  HENT:       Per HPI, otherwise negative  Respiratory:       Per HPI, otherwise negative  Cardiovascular:       Per HPI, otherwise negative  Gastrointestinal: Negative for vomiting.  Endocrine:       Negative aside from HPI  Genitourinary:       Neg aside from HPI   Musculoskeletal:       Per HPI, otherwise negative  Skin: Negative.   Neurological: Negative for syncope.    Allergies  Amoxicillin and Penicillins  Home Medications   Prior to Admission medications   Medication Sig Start Date End Date Taking? Authorizing Provider  alprazolam Prudy Feeler) 2 MG tablet Take 2 mg by mouth 2 (two) times daily as needed for anxiety.   Yes Historical Provider, MD  buprenorphine-naloxone (SUBOXONE) 8-2  MG SUBL SL tablet Place 2 tablets under the tongue daily.   Yes Historical Provider, MD  ibuprofen (ADVIL,MOTRIN) 600 MG tablet Take 1 tablet (600 mg total) by mouth every 6 (six) hours as needed. 05/19/15  Yes Kelly Humes, PA-C   BP 115/64 mmHg  Pulse 83  Temp(Src) 98.9 F (37.2 C) (Oral)  Resp 16  Ht  (1.854 m)  Wt 165 lb (74.844 kg)  BMI 21.77 kg/m2  SpO2 98% Physical Exam  Constitutional: He is oriented to person, place, and time. He appears well-developed. No distress.  HENT:  Head: Normocephalic and atraumatic.  Eyes: Conjunctivae and EOM are normal.  Cardiovascular: Normal rate and regular rhythm.   Pulmonary/Chest: Effort normal. No stridor. No respiratory distress.  Abdominal: He exhibits no distension.  Musculoskeletal: He exhibits no edema.  Neurological: He is alert and oriented to person, place, and time.  Skin: Skin is warm and dry.  Left forearm: area of induration about 3cm  in diameter with central open area of scabbing   Left superior medial gluteus:  Area of induration ~ 8 cm diameter, exquisitely tender; no fluctaunce    Psychiatric: He has a normal mood and affect.  Nursing note and vitals reviewed.   ED  Course  Procedures   DIAGNOSTIC STUDIES:  Oxygen Saturation is 98% on RA, normal by my interpretation.    COORDINATION OF CARE:  12:01 AM Discussed treatment plan with pt at bedside and pt agreed to plan.  Patient understands that with his history of addiction, provision of any narcotic analgesia medicine should be discussed with his physician before obtaining the medication.    MDM   Final diagnoses:  Abscess of gluteal region    I personally performed the services described in this documentation, which was scribed in my presence. The recorded information has been reviewed and is accurate.   Patient presents with 2 days of swelling in the superior gluteal cleft. Patient is afebrile, awake, alert, no evidence for bacteremia or  sepsis. The patient does have induration about the superior cleft, there is no area of fluctuance, no discernible area for drainage. Patient may be preventing early in the course of abscess formation, and was started on antibiotics, will have wound check in 2 days, with primary care.   Gerhard Munch, MD 06/19/15 [phone]

## 2015-06-18 NOTE — ED Notes (Signed)
Suture Cart at bedside.  

## 2015-06-18 NOTE — ED Notes (Signed)
Pt. presents with multiple abscesses at buttocks and left forearm onset this week , no drainage , denies fever or chills.

## 2015-06-19 MED ORDER — HYDROCODONE-ACETAMINOPHEN 5-325 MG PO TABS
1.0000 | ORAL_TABLET | Freq: Once | ORAL | Status: AC
  Administered 2015-06-19: 1 via ORAL
  Filled 2015-06-19: qty 1

## 2015-06-19 MED ORDER — KETOROLAC TROMETHAMINE 30 MG/ML IJ SOLN
30.0000 mg | Freq: Once | INTRAMUSCULAR | Status: AC
  Administered 2015-06-19: 30 mg via INTRAMUSCULAR
  Filled 2015-06-19: qty 1

## 2015-06-19 MED ORDER — SULFAMETHOXAZOLE-TRIMETHOPRIM 800-160 MG PO TABS: 1.0000 | ORAL_TABLET | Freq: Two times a day (BID) | ORAL | Status: DC

## 2015-06-19 MED ORDER — HYDROCODONE-ACETAMINOPHEN 5-325 MG PO TABS: 1.0000 | ORAL_TABLET | Freq: Four times a day (QID) | ORAL | Status: AC | PRN

## 2015-06-19 MED ORDER — SULFAMETHOXAZOLE-TRIMETHOPRIM 800-160 MG PO TABS
1.0000 | ORAL_TABLET | Freq: Once | ORAL | Status: AC
  Administered 2015-06-19: 1 via ORAL
  Filled 2015-06-19: qty 1

## 2015-06-19 NOTE — Discharge Instructions (Signed)
As discussed, with your infection  is very important that you monitor your condition carefully, and be sure to have it reevaluated in 48 hours.  For the next 2 days, please use Epsom salts baths, 4 times daily.  Please call your prescribing physician tomorrow to ensure appropriate ongoing care.  Abscess An abscess is an infected area that contains a collection of pus and debris.It can occur in almost any part of the body. An abscess is also known as a furuncle or boil. CAUSES  An abscess occurs when tissue gets infected. This can occur from blockage of oil or sweat glands, infection of hair follicles, or a minor injury to the skin. As the body tries to fight the infection, pus collects in the area and creates pressure under the skin. This pressure causes pain. People with weakened immune systems have difficulty fighting infections and get certain abscesses more often.  SYMPTOMS Usually an abscess develops on the skin and becomes a painful mass that is red, warm, and tender. If the abscess forms under the skin, you may feel a moveable soft area under the skin. Some abscesses break open (rupture) on their own, but most will continue to get worse without care. The infection can spread deeper into the body and eventually into the bloodstream, causing you to feel ill.  DIAGNOSIS  Your caregiver will take your medical history and perform a physical exam. A sample of fluid may also be taken from the abscess to determine what is causing your infection. TREATMENT  Your caregiver may prescribe antibiotic medicines to fight the infection. However, taking antibiotics alone usually does not cure an abscess. Your caregiver may need to make a small cut (incision) in the abscess to drain the pus. In some cases, gauze is packed into the abscess to reduce pain and to continue draining the area. HOME CARE INSTRUCTIONS   Only take over-the-counter or prescription medicines for pain, discomfort, or fever as directed by  your caregiver.  If you were prescribed antibiotics, take them as directed. Finish them even if you start to feel better.  If gauze is used, follow your caregiver's directions for changing the gauze.  To avoid spreading the infection:  Keep your draining abscess covered with a bandage.  Wash your hands well.  Do not share personal care items, towels, or whirlpools with others.  Avoid skin contact with others.  Keep your skin and clothes clean around the abscess.  Keep all follow-up appointments as directed by your caregiver. SEEK MEDICAL CARE IF:   You have increased pain, swelling, redness, fluid drainage, or bleeding.  You have muscle aches, chills, or a general ill feeling.  You have a fever. MAKE SURE YOU:   Understand these instructions.  Will watch your condition.  Will get help right away if you are not doing well or get worse. Document Released: 06/23/2005 Document Revised: 03/14/2012 Document Reviewed: 11/26/2011 [facility] Patient Information 2015 [place], [place]. This information is not intended to replace advice given to you by your health care provider. Make sure you discuss any questions you have with your health care provider.

## 2015-06-19 NOTE — ED Notes (Signed)
Pt. Left with all belongings and refused wheelchair. Discharge instructions were reviewed and all questions were answered.  

## 2021-06-28 ENCOUNTER — Encounter (HOSPITAL_COMMUNITY): Payer: Self-pay

## 2021-06-28 ENCOUNTER — Emergency Department (HOSPITAL_COMMUNITY)
Admission: EM | Admit: 2021-06-28 | Discharge: 2021-06-29 | Disposition: A | Discharge status: Home / Self Care | Payer: Self-pay | Attending: Emergency Medicine | Admitting: Emergency Medicine

## 2021-06-28 DIAGNOSIS — R21 Rash and other nonspecific skin eruption: Secondary | ICD-10-CM | POA: Insufficient documentation

## 2021-06-28 DIAGNOSIS — F17210 Nicotine dependence, cigarettes, uncomplicated: Secondary | ICD-10-CM | POA: Insufficient documentation

## 2021-06-28 NOTE — ED Triage Notes (Signed)
Pt c/o rash x2 wks, neosporin at home. Partner states she had a "lump" in armpit, states she was put on abx for same. Pt "popped bump & has been passing infection back & forth." Denies IVDU

## 2021-06-28 NOTE — ED Triage Notes (Signed)
Pt advises that rash present on multiple spots of body: back of hand, back of ankle, bottom of foot near toes. Ankle monitor on since May

## 2021-06-29 MED ORDER — SULFAMETHOXAZOLE-TRIMETHOPRIM 800-160 MG PO TABS: 1.0000 | ORAL_TABLET | Freq: Two times a day (BID) | ORAL | 0 refills | Status: DC

## 2021-06-29 MED ORDER — SULFAMETHOXAZOLE-TRIMETHOPRIM 800-160 MG PO TABS: 1.0000 | ORAL_TABLET | Freq: Two times a day (BID) | ORAL | 0 refills | Status: AC

## 2021-06-29 NOTE — ED Provider Notes (Signed)
[facility] EMERGENCY DEPARTMENT Provider Note   CSN: [id number] Arrival date & time: 06/28/21  2313     History Chief Complaint  Patient presents with  . Rash    Lucas Wong is a 40 y.o. male.  Reports hx of "staph infection". Denies IVDU.  The history is provided by the patient. No language interpreter was used.  Rash Location: scattered to body. Quality: redness, scaling and weeping   Quality: not bruising, not painful and not swelling   Onset quality:  Gradual Duration:  2 weeks Timing:  Constant Progression:  Spreading Chronicity:  New Context: not medications and not sick contacts   Relieved by:  None tried Associated symptoms: no fever, no joint pain and no nausea       Past Medical History:  Diagnosis Date  . Carpal tunnel syndrome 10/04/2014   bilateral  . Opiate addiction [facility])     Patient Active Problem List   Diagnosis Date Noted  . Carpal tunnel syndrome 10/04/2014    Past Surgical History:  Procedure Laterality Date  . FOOT SURGERY    . FOOT SURGERY        No family history on file.  Social History   Tobacco Use  . Smoking status: Every Day    Packs/day: 0.00    Types: Cigarettes  . Smokeless tobacco: Never  Substance Use Topics  . Alcohol use: No  . Drug use: No    Home Medications Prior to Admission medications   Medication Sig Start Date End Date Taking? Authorizing Provider  alprazolam Prudy Feeler) 2 MG tablet Take 2 mg by mouth 2 (two) times daily as needed for anxiety.    [provider]  buprenorphine-naloxone (SUBOXONE) 8-2 MG SUBL SL tablet Place 2 tablets under the tongue daily.    [provider]  HYDROcodone-acetaminophen (NORCO/VICODIN) 5-325 MG per tablet Take 1 tablet by mouth every 6 (six) hours as needed for moderate pain. 06/19/15   Gerhard Munch, MD  ibuprofen (ADVIL,MOTRIN) 600 MG tablet Take 1 tablet (600 mg total) by mouth every 6 (six) hours as needed. 05/19/15   Antony Madura, PA-C  sulfamethoxazole-trimethoprim (BACTRIM DS) 800-160 MG tablet Take 1 tablet by mouth 2 (two) times daily. 06/29/21   Antony Madura, PA-C    Allergies    Amoxicillin and Penicillins  Review of Systems   Review of Systems  Constitutional:  Negative for fever.  Gastrointestinal:  Negative for nausea.  Musculoskeletal:  Negative for arthralgias.  Skin:  Positive for rash.  Ten systems reviewed and are negative for acute change, except as noted in the HPI.    Physical Exam Updated Vital Signs BP 109/68 (BP Location: Left Arm)   Pulse 69   Temp 98.5 F (36.9 C) (Oral)   Resp 13   SpO2 96%   Physical Exam Vitals and nursing note reviewed.  Constitutional:      General: He is not in acute distress.    Appearance: He is well-developed. He is not diaphoretic.     Comments: Nontoxic appearing and in NAD  HENT:     Head: Normocephalic and atraumatic.  Eyes:     General: No scleral icterus.    Conjunctiva/sclera: Conjunctivae normal.  Pulmonary:     Effort: Pulmonary effort is normal. No respiratory distress.     Comments: Respirations even and unlabored Musculoskeletal:        General: Normal range of motion.     Cervical back: Normal range of motion.  Skin:    General: Skin is warm and dry.     Coloration: Skin is not pale.     Findings: Rash present.     Comments: Scattered macules with overlying skin crusting and scabbing. No active drainage or associated induration, edema, crepitus. Largest to dorsum of L hand, but noted also on the R wrist, L ankle x 2.  Neurological:     Mental Status: He is alert and oriented to person, place, and time.  Psychiatric:        Behavior: Behavior normal.    ED Results / Procedures / Treatments   Labs (all labs ordered are listed, but only abnormal results are displayed) Labs Reviewed - No data to display  EKG None  Radiology No results found.  Procedures Procedures   Medications Ordered in ED Medications - No data  to display  ED Course  I have reviewed the triage vital signs and the nursing notes.  Pertinent labs & imaging results that were available during my care of the patient were reviewed by me and considered in my medical decision making (see chart for details).    MDM Rules/Calculators/A&P                           40 year old with macular lesions scattered primarily to distal extremities.  No concern for secondary infection or cellulitis.  Patient reports similar rash in the past associated with "staph infection".  Denies IV drug use.  He was started on Bactrim given stated history.  Advised follow-up with a primary care doctor for wound recheck.  Discharged in stable condition with no unaddressed concerns.   Final Clinical Impression(s) / ED Diagnoses Final diagnoses:  Macular rash    Rx / DC Orders ED Discharge Orders          Ordered    sulfamethoxazole-trimethoprim (BACTRIM DS) 800-160 MG tablet  2 times daily,   Status:  Discontinued        06/29/21 0018    sulfamethoxazole-trimethoprim (BACTRIM DS) 800-160 MG tablet  2 times daily        06/29/21 0018             Antony Madura, PA-C 06/29/21 Rayburn Go, MD 06/29/21 [phone]
